# Patient Record
Sex: Female | Born: 1965 | Race: White | Hispanic: No | Marital: Married | Smoking: Never smoker
Health system: Southern US, Community
[De-identification: ages and names within clinical notes are randomized; demographics above are authoritative.]

## PROBLEM LIST (undated history)

## (undated) DIAGNOSIS — F419 Anxiety disorder, unspecified: Secondary | ICD-10-CM

## (undated) DIAGNOSIS — F329 Major depressive disorder, single episode, unspecified: Secondary | ICD-10-CM

## (undated) DIAGNOSIS — M549 Dorsalgia, unspecified: Secondary | ICD-10-CM

## (undated) DIAGNOSIS — Z98891 History of uterine scar from previous surgery: Secondary | ICD-10-CM

## (undated) DIAGNOSIS — M1990 Unspecified osteoarthritis, unspecified site: Secondary | ICD-10-CM

## (undated) DIAGNOSIS — F32A Depression, unspecified: Secondary | ICD-10-CM

---
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
## 2006-12-16 ENCOUNTER — Emergency Department (HOSPITAL_COMMUNITY): Admission: EM | Admit: 2006-12-16 | Discharge: 2006-12-16 | Payer: Self-pay | Admitting: Emergency Medicine

## 2006-12-18 ENCOUNTER — Emergency Department (HOSPITAL_COMMUNITY): Admission: EM | Admit: 2006-12-18 | Discharge: 2006-12-18 | Payer: Self-pay | Admitting: Family Medicine

## 2011-03-25 LAB — WOUND CULTURE

## 2011-06-27 ENCOUNTER — Emergency Department (HOSPITAL_COMMUNITY)
Admission: EM | Admit: 2011-06-27 | Discharge: 2011-06-27 | Disposition: A | Discharge status: Home / Self Care | Payer: No Typology Code available for payment source | Attending: Emergency Medicine | Admitting: Emergency Medicine

## 2011-06-27 ENCOUNTER — Encounter (HOSPITAL_COMMUNITY): Payer: Self-pay | Admitting: Emergency Medicine

## 2011-06-27 DIAGNOSIS — R51 Headache: Secondary | ICD-10-CM | POA: Insufficient documentation

## 2011-06-27 DIAGNOSIS — Y92410 Unspecified street and highway as the place of occurrence of the external cause: Secondary | ICD-10-CM | POA: Insufficient documentation

## 2011-06-27 DIAGNOSIS — IMO0002 Reserved for concepts with insufficient information to code with codable children: Secondary | ICD-10-CM

## 2011-06-27 DIAGNOSIS — S0100XA Unspecified open wound of scalp, initial encounter: Secondary | ICD-10-CM | POA: Insufficient documentation

## 2011-06-27 HISTORY — DX: History of uterine scar from previous surgery: Z98.891

## 2011-06-27 MED ORDER — HYDROCODONE-ACETAMINOPHEN 5-500 MG PO TABS: 1.0000 | ORAL_TABLET | Freq: Four times a day (QID) | ORAL | Status: AC | PRN

## 2011-06-27 MED ORDER — TETANUS-DIPHTH-ACELL PERTUSSIS 5-2.5-18.5 LF-MCG/0.5 IM SUSP
0.5000 mL | Freq: Once | INTRAMUSCULAR | Status: AC
  Administered 2011-06-27: 0.5 mL via INTRAMUSCULAR
  Filled 2011-06-27: qty 0.5

## 2011-06-27 MED ORDER — IBUPROFEN 800 MG PO TABS: 800.0000 mg | ORAL_TABLET | Freq: Three times a day (TID) | ORAL | Status: AC

## 2011-06-27 MED ORDER — CYCLOBENZAPRINE HCL 10 MG PO TABS: 10.0000 mg | ORAL_TABLET | Freq: Two times a day (BID) | ORAL | Status: AC | PRN

## 2011-06-27 MED ORDER — IBUPROFEN 800 MG PO TABS
800.0000 mg | ORAL_TABLET | Freq: Once | ORAL | Status: AC
  Administered 2011-06-27: 800 mg via ORAL
  Filled 2011-06-27: qty 1
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
## 2011-06-27 NOTE — ED Notes (Signed)
Pt tolerated ambulation well. Pt did not complain of any dizziness, light headiness, weakness or blurred vision
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
## 2017-12-14 DIAGNOSIS — M5030 Other cervical disc degeneration, unspecified cervical region: Secondary | ICD-10-CM | POA: Insufficient documentation

## 2017-12-14 DIAGNOSIS — M47812 Spondylosis without myelopathy or radiculopathy, cervical region: Secondary | ICD-10-CM | POA: Insufficient documentation

## 2017-12-14 DIAGNOSIS — M47816 Spondylosis without myelopathy or radiculopathy, lumbar region: Secondary | ICD-10-CM | POA: Insufficient documentation

## 2017-12-14 DIAGNOSIS — G894 Chronic pain syndrome: Secondary | ICD-10-CM | POA: Diagnosis present

## 2017-12-14 DIAGNOSIS — M4802 Spinal stenosis, cervical region: Secondary | ICD-10-CM | POA: Insufficient documentation

## 2018-05-04 DIAGNOSIS — F411 Generalized anxiety disorder: Secondary | ICD-10-CM | POA: Diagnosis present

## 2018-08-16 DIAGNOSIS — F329 Major depressive disorder, single episode, unspecified: Secondary | ICD-10-CM | POA: Diagnosis present

## 2019-02-08 DIAGNOSIS — S6721XA Crushing injury of right hand, initial encounter: Secondary | ICD-10-CM | POA: Insufficient documentation

## 2019-03-06 ENCOUNTER — Encounter (HOSPITAL_COMMUNITY): Payer: Self-pay

## 2019-03-06 ENCOUNTER — Emergency Department (HOSPITAL_COMMUNITY): Payer: BC Managed Care – PPO

## 2019-03-06 ENCOUNTER — Observation Stay (HOSPITAL_COMMUNITY)
Admission: EM | Admit: 2019-03-06 | Discharge: 2019-03-07 | Disposition: A | Discharge status: Home / Self Care | Payer: BC Managed Care – PPO | Attending: Family Medicine | Admitting: Family Medicine

## 2019-03-06 DIAGNOSIS — E876 Hypokalemia: Secondary | ICD-10-CM | POA: Insufficient documentation

## 2019-03-06 DIAGNOSIS — R404 Transient alteration of awareness: Secondary | ICD-10-CM | POA: Diagnosis present

## 2019-03-06 DIAGNOSIS — R4182 Altered mental status, unspecified: Secondary | ICD-10-CM | POA: Diagnosis present

## 2019-03-06 DIAGNOSIS — R400 Somnolence: Secondary | ICD-10-CM | POA: Diagnosis not present

## 2019-03-06 DIAGNOSIS — F411 Generalized anxiety disorder: Secondary | ICD-10-CM | POA: Diagnosis not present

## 2019-03-06 DIAGNOSIS — G894 Chronic pain syndrome: Secondary | ICD-10-CM | POA: Diagnosis not present

## 2019-03-06 DIAGNOSIS — F329 Major depressive disorder, single episode, unspecified: Secondary | ICD-10-CM | POA: Insufficient documentation

## 2019-03-06 DIAGNOSIS — Z20828 Contact with and (suspected) exposure to other viral communicable diseases: Secondary | ICD-10-CM | POA: Diagnosis not present

## 2019-03-06 HISTORY — DX: Dorsalgia, unspecified: M54.9

## 2019-03-06 HISTORY — DX: Depression, unspecified: F32.A

## 2019-03-06 HISTORY — DX: Unspecified osteoarthritis, unspecified site: M19.90

## 2019-03-06 LAB — RAPID URINE DRUG SCREEN, HOSP PERFORMED
Amphetamines: POSITIVE — AB
Barbiturates: NOT DETECTED
Benzodiazepines: NOT DETECTED
Cocaine: NOT DETECTED
Opiates: NOT DETECTED
Tetrahydrocannabinol: NOT DETECTED

## 2019-03-06 LAB — COMPREHENSIVE METABOLIC PANEL
ALT: 14 U/L (ref 0–44)
AST: 13 U/L — ABNORMAL LOW (ref 15–41)
Albumin: 3.5 g/dL (ref 3.5–5.0)
Alkaline Phosphatase: 50 U/L (ref 38–126)
Anion gap: 7 (ref 5–15)
BUN: 18 mg/dL (ref 6–20)
CO2: 27 mmol/L (ref 22–32)
Calcium: 8.9 mg/dL (ref 8.9–10.3)
Chloride: 107 mmol/L (ref 98–111)
Creatinine, Ser: 0.69 mg/dL (ref 0.44–1.00)
GFR calc Af Amer: >60 mL/min (ref >60)
GFR calc non Af Amer: >60 mL/min (ref >60)
Glucose, Bld: 126 mg/dL — ABNORMAL HIGH (ref 70–99)
Potassium: 3.2 mmol/L — ABNORMAL LOW (ref 3.5–5.1)
Sodium: 141 mmol/L (ref 135–145)
Total Bilirubin: 0.6 mg/dL (ref 0.3–1.2)
Total Protein: 6.5 g/dL (ref 6.5–8.1)

## 2019-03-06 LAB — URINALYSIS, ROUTINE W REFLEX MICROSCOPIC
Bilirubin Urine: NEGATIVE
Glucose, UA: NEGATIVE mg/dL
Hgb urine dipstick: NEGATIVE
Ketones, ur: NEGATIVE mg/dL
Leukocytes,Ua: NEGATIVE
Nitrite: NEGATIVE
Protein, ur: NEGATIVE mg/dL
Specific Gravity, Urine: 1.009 (ref 1.005–1.030)
pH: 6 (ref 5.0–8.0)

## 2019-03-06 LAB — CBC WITH DIFFERENTIAL/PLATELET
Abs Immature Granulocytes: 0.06 10*3/uL (ref 0.00–0.07)
Basophils Absolute: 0 10*3/uL (ref 0.0–0.1)
Basophils Relative: 0 %
Eosinophils Absolute: 0.1 10*3/uL (ref 0.0–0.5)
Eosinophils Relative: 1 %
HCT: 43.4 % (ref 36.0–46.0)
Hemoglobin: 13.6 g/dL (ref 12.0–15.0)
Immature Granulocytes: 1 %
Lymphocytes Relative: 13 %
Lymphs Abs: 1.5 10*3/uL (ref 0.7–4.0)
MCH: 29.1 pg (ref 26.0–34.0)
MCHC: 31.3 g/dL (ref 30.0–36.0)
MCV: 92.7 fL (ref 80.0–100.0)
Monocytes Absolute: 0.7 10*3/uL (ref 0.1–1.0)
Monocytes Relative: 6 %
Neutro Abs: 9 10*3/uL — ABNORMAL HIGH (ref 1.7–7.7)
Neutrophils Relative %: 79 %
Platelets: 312 10*3/uL (ref 150–400)
RBC: 4.68 MIL/uL (ref 3.87–5.11)
RDW: 13.3 % (ref 11.5–15.5)
WBC: 11.4 10*3/uL — ABNORMAL HIGH (ref 4.0–10.5)
nRBC: 0 % (ref 0.0–0.2)

## 2019-03-06 LAB — I-STAT CHEM 8, ED
BUN: 17 mg/dL (ref 6–20)
Calcium, Ion: 1.25 mmol/L (ref 1.15–1.40)
Chloride: 105 mmol/L (ref 98–111)
Creatinine, Ser: 0.7 mg/dL (ref 0.44–1.00)
Glucose, Bld: 119 mg/dL — ABNORMAL HIGH (ref 70–99)
HCT: 42 % (ref 36.0–46.0)
Hemoglobin: 14.3 g/dL (ref 12.0–15.0)
Potassium: 3.3 mmol/L — ABNORMAL LOW (ref 3.5–5.1)
Sodium: 142 mmol/L (ref 135–145)
TCO2: 27 mmol/L (ref 22–32)

## 2019-03-06 LAB — LACTIC ACID, PLASMA: Lactic Acid, Venous: 1.1 mmol/L (ref 0.5–1.9)

## 2019-03-06 LAB — AMMONIA: Ammonia: 11 umol/L (ref 9–35)

## 2019-03-06 LAB — TROPONIN I (HIGH SENSITIVITY)
Troponin I (High Sensitivity): 3 ng/L (ref <18)
Troponin I (High Sensitivity): 3 ng/L (ref <18)

## 2019-03-06 LAB — ETHANOL: Alcohol, Ethyl (B): <10 mg/dL (ref <10)

## 2019-03-06 LAB — SARS CORONAVIRUS 2 BY RT PCR (HOSPITAL ORDER, PERFORMED IN ~~LOC~~ HOSPITAL LAB): SARS Coronavirus 2: NEGATIVE

## 2019-03-06 MED ORDER — POTASSIUM CHLORIDE IN NACL 20-0.9 MEQ/L-% IV SOLN
INTRAVENOUS | Status: AC
  Administered 2019-03-06: 20:00:00 via INTRAVENOUS
  Filled 2019-03-06: qty 1000

## 2019-03-06 MED ORDER — ACETAMINOPHEN 650 MG RE SUPP: 650.0000 mg | Freq: Four times a day (QID) | RECTAL | Status: DC | PRN

## 2019-03-06 MED ORDER — NALOXONE HCL 2 MG/2ML IJ SOSY
1.0000 mg | PREFILLED_SYRINGE | Freq: Once | INTRAMUSCULAR | Status: AC
  Administered 2019-03-06: 1 mg via INTRAVENOUS
  Filled 2019-03-06: qty 2

## 2019-03-06 MED ORDER — ENOXAPARIN SODIUM 40 MG/0.4ML ~~LOC~~ SOLN
40.0000 mg | SUBCUTANEOUS | Status: DC
  Administered 2019-03-06: 20:00:00 40 mg via SUBCUTANEOUS
  Filled 2019-03-06: qty 0.4

## 2019-03-06 MED ORDER — SODIUM CHLORIDE 0.9 % IV BOLUS
1000.0000 mL | Freq: Once | INTRAVENOUS | Status: AC
  Administered 2019-03-06: 16:00:00 1000 mL via INTRAVENOUS

## 2019-03-06 MED ORDER — ACETAMINOPHEN 325 MG PO TABS
650.0000 mg | ORAL_TABLET | Freq: Four times a day (QID) | ORAL | Status: DC | PRN
  Administered 2019-03-07: 650 mg via ORAL
  Filled 2019-03-06: qty 2

## 2019-03-06 NOTE — ED Notes (Addendum)
Pt presents with meds from 3 different providers filled by [facility] in [facility]. White  Vvanse  4mg  Qam  Clonazapam 0.5mg  BID PRN anxiety  Meloxicam 7.5mg  QD  D O'Toole  Tizanidine 4 mg Q 8 H PRN  S.Truhe Baclofen 10 mg TID x 30 days

## 2019-03-06 NOTE — ED Notes (Signed)
spouse at bedside   Requested to speak with physician re labs and test   Dr Earnest Conroy apprised

## 2019-03-06 NOTE — ED Notes (Signed)
Spouse says pt was hard to arouse yesterday afternoon.  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

## 2019-03-06 NOTE — ED Notes (Signed)
Report to Natasha, RN

## 2019-03-06 NOTE — ED Notes (Signed)
Dr Roderic Palau spoke with spouse   spouse states she has spoken with a friend  Pt has not responded to this RN save open her eyes during covid

## 2019-03-06 NOTE — H&P (Signed)
TRH H&P    Patient Demographics:    Jenna Morris, is a 53 y.o. female  MRN: [id number]  DOB - 02/25/1966  Admit Date - 03/06/2019  Referring MD/NP/PA: Denton Meek  Outpatient Primary MD for the patient is Patient, No Pcp Per [facility] in [place]  Patient coming from:  home  Chief complaint- altered mental status.    HPI:    Jenna Morris  is a 53 y.o. female, w anxiety/ depression, ADD, chronic neck pain, apparently presents with c/o altered mental status. Pt reportedly took some muscle relaxer. Started on Friday.  Hard to awaken since Saturday nite. Apparently fell asleep at church and then went to her fathers house and was somnolent and brought to ED.   In ED,  T 97 , P 49 R 16, Bp 113/74 pox 98% on RA  CT brain  IMPRESSION: Negative non contrasted CT appearance of the brain  CXR IMPRESSION: Mild cardiomegaly with mild central pulmonary vascular congestion  Na 141, K 3.2, Bun 18, Creatinine 0.69 Ast 13, Alt  14 Wbc 11.4, hgb 13.6, Plt 312 Ammonia 11  Urinalysis negative UDS + amphetamine  Pt will be admitted for altered mental status secondary to ? Muscle relaxer use.     Review of systems:    In addition to the HPI above, pt unable to give ROS due to AMS  No Fever-chills, No Headache, No changes with Vision or hearing, No problems swallowing food or Liquids, No Chest pain, Cough or Shortness of Breath, No Abdominal pain, No Nausea or Vomiting, bowel movements are regular, No Blood in stool or Urine, No dysuria, No new skin rashes or bruises, No new joints pains-aches,  No new weakness, tingling, numbness in any extremity, No recent weight gain or loss, No polyuria, polydypsia or polyphagia, No significant Mental Stressors.  All other systems reviewed and are negative.    Past History of the following :    Past Medical History:  Diagnosis Date  . Back pain    . Depression   . DJD (degenerative joint disease)       History reviewed. No pertinent surgical history. Unable to obtain due to AMS   Social History:      Social History   Tobacco Use  . Smoking status: Never Smoker  . Smokeless tobacco: Never Used  Substance Use Topics  . Alcohol use: Never    Frequency: Never       Family History :    No family history on file. Unable to obtain due to AMS   Home Medications:   Prior to Admission medications   Medication Sig Start Date End Date Taking? Authorizing Provider  baclofen (LIORESAL) 10 MG tablet Take 10 mg by mouth 3 (three) times daily.  03/04/19 04/03/19 Yes [provider]  clonazePAM (KLONOPIN) 0.5 MG tablet Take 0.5 mg by mouth 2 (two) times daily as needed for anxiety.  11/25/18  Yes [provider]  ferrous sulfate 325 (65 FE) MG tablet Take 325 mg by mouth daily with breakfast.  01/17/19  Yes [provider]  gabapentin (NEURONTIN) 600 MG tablet Take 600 mg by mouth 3 (three) times daily.  03/04/19 06/02/19 Yes [provider]  lisdexamfetamine (VYVANSE) 40 MG capsule Take 40 mg by mouth every morning.  02/03/19  Yes [provider]  meloxicam (MOBIC) 7.5 MG tablet Take 7.5 mg by mouth daily.  12/21/18  Yes [provider]  predniSONE (DELTASONE) 10 MG tablet Take 10 mg by mouth See admin instructions. Take as directed per package instructions: 6 tablets on day 1, then 5 tablets on day 2, then 4 tablets on day 3, then 3 tablets on day 4, then 2 tablets on day 5, then take 1 tablet on day 6, THEN STOP. 02/28/19 03/06/19 Yes [provider]  sertraline (ZOLOFT) 50 MG tablet Take 50 mg by mouth daily.  01/17/19  Yes [provider]  tiZANidine (ZANAFLEX) 4 MG tablet Take 4 mg by mouth every 8 (eight) hours as needed for muscle spasms.  10/25/18  Yes [provider]  ibuprofen (ADVIL) 200 MG tablet Take by mouth.    [provider]  naproxen  sodium (ALEVE) 220 MG tablet Take 220 mg by mouth daily as needed (for pain).     [provider]     Allergies:     Allergies  Allergen Reactions  . Penicillins Hives  . Aspirin Rash     Physical Exam:   Vitals  Blood pressure (!) 143/86, pulse (!) 50, temperature (!) 97 F (36.1 C), temperature source Rectal, resp. rate 16, SpO2 100 %.  1.  General: somnolent  2. Psychiatric: Unable to assess due to somnolence  3. Neurologic: cn2-12 intact, reflexes 2+ symmetric, diffuse with no clonus, motor 5/5 in all 4 ext (moves all 4 ext to painful stimuli)  4. HEENMT:  Anicteric, pupils 52m symmetric, direct, consensual intact Neck: no jvd  5. Respiratory : CTAB  6. Cardiovascular : rrr s1, s2,   7. Gastrointestinal:  Abd: soft, nt, nd, +bs  8. Skin:  Ext: no c/c/e, no rash + tattoo  9.Musculoskeletal:  Good ROM    Data Review:    CBC Recent Labs  Lab 03/06/19 1600  WBC 11.4*  HGB 13.6  14.3  HCT 43.4  42.0  PLT 312  MCV 92.7  MCH 29.1  MCHC 31.3  RDW 13.3  LYMPHSABS 1.5  MONOABS 0.7  EOSABS 0.1  BASOSABS 0.0   ------------------------------------------------------------------------------------------------------------------  Results for orders placed or performed during the hospital encounter of 03/06/19 (from the past 48 hour(s))  I-stat chem 8, [facility]     Status: Abnormal   Collection Time: 03/06/19  4:00 PM  Result Value Ref Range   Sodium 142 135 - 145 mmol/L   Potassium 3.3 (L) 3.5 - 5.1 mmol/L   Chloride 105 98 - 111 mmol/L   BUN 17 6 - 20 mg/dL   Creatinine, Ser 0.70 0.44 - 1.00 mg/dL   Glucose, Bld 119 (H) 70 - 99 mg/dL   Calcium, Ion 1.25 1.15 - 1.40 mmol/L   TCO2 27 22 - 32 mmol/L   Hemoglobin 14.3 12.0 - 15.0 g/dL   HCT 42.0 36.0 - 46.0 %  CBC with Differential/Platelet     Status: Abnormal   Collection Time: 03/06/19  4:00 PM  Result Value Ref Range   WBC 11.4 (H) 4.0 - 10.5 K/uL   RBC 4.68 3.87 -  5.11 MIL/uL   Hemoglobin 13.6 12.0 - 15.0 g/dL  HCT 43.4 36.0 - 46.0 %   MCV 92.7 80.0 - 100.0 fL   MCH 29.1 26.0 - 34.0 pg   MCHC 31.3 30.0 - 36.0 g/dL   RDW 13.3 11.5 - 15.5 %   Platelets 312 150 - 400 K/uL   nRBC 0.0 0.0 - 0.2 %   Neutrophils Relative % 79 %   Neutro Abs 9.0 (H) 1.7 - 7.7 K/uL   Lymphocytes Relative 13 %   Lymphs Abs 1.5 0.7 - 4.0 K/uL   Monocytes Relative 6 %   Monocytes Absolute 0.7 0.1 - 1.0 K/uL   Eosinophils Relative 1 %   Eosinophils Absolute 0.1 0.0 - 0.5 K/uL   Basophils Relative 0 %   Basophils Absolute 0.0 0.0 - 0.1 K/uL   Immature Granulocytes 1 %   Abs Immature Granulocytes 0.06 0.00 - 0.07 K/uL    Comment: Performed at [facility], [address], [place], [place] [address]  Comprehensive metabolic panel     Status: Abnormal   Collection Time: 03/06/19  4:00 PM  Result Value Ref Range   Sodium 141 135 - 145 mmol/L   Potassium 3.2 (L) 3.5 - 5.1 mmol/L   Chloride 107 98 - 111 mmol/L   CO2 27 22 - 32 mmol/L   Glucose, Bld 126 (H) 70 - 99 mg/dL   BUN 18 6 - 20 mg/dL   Creatinine, Ser 0.69 0.44 - 1.00 mg/dL   Calcium 8.9 8.9 - 10.3 mg/dL   Total Protein 6.5 6.5 - 8.1 g/dL   Albumin 3.5 3.5 - 5.0 g/dL   AST 13 (L) 15 - 41 U/L   ALT 14 0 - 44 U/L   Alkaline Phosphatase 50 38 - 126 U/L   Total Bilirubin 0.6 0.3 - 1.2 mg/dL   GFR calc non Af Amer >60 >60 mL/min   GFR calc Af Amer >60 >60 mL/min   Anion gap 7 5 - 15    Comment: Performed at [facility], [address] [place], [place] [address]  Lactic acid, plasma     Status: None   Collection Time: 03/06/19  4:00 PM  Result Value Ref Range   Lactic Acid, Venous 1.1 0.5 - 1.9 mmol/L    Comment: Performed at [facility], [address], [place], [place] [address]  Ammonia     Status: None   Collection Time: 03/06/19  4:00 PM  Result Value Ref Range   Ammonia 11 9 - 35 umol/L    Comment: Performed at [facility], [address], [place], [place] [address]  Troponin I (High Sensitivity)      Status: None   Collection Time: 03/06/19  4:00 PM  Result Value Ref Range   Troponin I (High Sensitivity) 3 <18 ng/L    Comment: (NOTE) Elevated high sensitivity troponin I (hsTnI) values and significant  changes across serial measurements may suggest ACS but many other  chronic and acute conditions are known to elevate hsTnI results.  Refer to the "Links" section for chest pain algorithms and additional  guidance. Performed at [facility], [address], [place], [place] [address]   Urinalysis, Routine w reflex microscopic     Status: None   Collection Time: 03/06/19  4:00 PM  Result Value Ref Range   Color, Urine YELLOW YELLOW   APPearance CLEAR CLEAR   Specific Gravity, Urine 1.009 1.005 - 1.030   pH 6.0 5.0 - 8.0   Glucose, UA NEGATIVE NEGATIVE mg/dL   Hgb urine dipstick NEGATIVE  NEGATIVE   Bilirubin Urine NEGATIVE NEGATIVE   Ketones, ur NEGATIVE NEGATIVE mg/dL   Protein, ur NEGATIVE NEGATIVE mg/dL   Nitrite NEGATIVE NEGATIVE   Leukocytes,Ua NEGATIVE NEGATIVE    Comment: Performed at [facility], [address], [place], [place] [address]  Rapid urine drug screen (hospital performed)     Status: Abnormal   Collection Time: 03/06/19  4:00 PM  Result Value Ref Range   Opiates NONE DETECTED NONE DETECTED   Cocaine NONE DETECTED NONE DETECTED   Benzodiazepines NONE DETECTED NONE DETECTED   Amphetamines POSITIVE (A) NONE DETECTED   Tetrahydrocannabinol NONE DETECTED NONE DETECTED   Barbiturates NONE DETECTED NONE DETECTED    Comment: (NOTE) DRUG SCREEN FOR MEDICAL PURPOSES ONLY.  IF CONFIRMATION IS NEEDED FOR ANY PURPOSE, NOTIFY LAB WITHIN 5 DAYS. LOWEST DETECTABLE LIMITS FOR URINE DRUG SCREEN Drug Class                     Cutoff (ng/mL) Amphetamine and metabolites    1000 Barbiturate and metabolites    200 Benzodiazepine                 588 Tricyclics and metabolites     300 Opiates and metabolites        300 Cocaine and metabolites        300 THC                             50 Performed at [facility], [address], [place], [place] [address]   Ethanol     Status: None   Collection Time: 03/06/19  4:00 PM  Result Value Ref Range   Alcohol, Ethyl (B) <10 <10 mg/dL    Comment: (NOTE) Lowest detectable limit for serum alcohol is 10 mg/dL. For medical purposes only. Performed at [facility], [address], [place],  [address]   SARS Coronavirus 2 [facility] order, Performed in [facility] hospital lab) Nasopharyngeal Nasopharyngeal Swab     Status: None   Collection Time: 03/06/19  4:53 PM   Specimen: Nasopharyngeal Swab  Result Value Ref Range   SARS Coronavirus 2 NEGATIVE NEGATIVE    Comment: (NOTE) If result is NEGATIVE SARS-CoV-2 target nucleic acids are NOT DETECTED. The SARS-CoV-2 RNA is generally detectable in upper and lower  respiratory specimens during the acute phase of infection. The lowest  concentration of SARS-CoV-2 viral copies this assay can detect is 250  copies / mL. A negative result does not preclude SARS-CoV-2 infection  and should not be used as the sole basis for treatment or other  patient management decisions.  A negative result may occur with  improper specimen collection / handling, submission of specimen other  than nasopharyngeal swab, presence of viral mutation(s) within the  areas targeted by this assay, and inadequate number of viral copies  (<250 copies / mL). A negative result must be combined with clinical  observations, patient history, and epidemiological information. If result is POSITIVE SARS-CoV-2 target nucleic acids are DETECTED. The SARS-CoV-2 RNA is generally detectable in upper and lower  respiratory specimens dur ing the acute phase of infection.  Positive  results are indicative of active infection with SARS-CoV-2.  Clinical  correlation with patient history and other diagnostic information is  necessary to determine patient infection status.  Positive results do  not rule out bacterial  infection or co-infection with other viruses. If result is PRESUMPTIVE POSTIVE SARS-CoV-2 nucleic acids  MAY BE PRESENT.   A presumptive positive result was obtained on the submitted specimen  and confirmed on repeat testing.  While 2019 novel coronavirus  (SARS-CoV-2) nucleic acids may be present in the submitted sample  additional confirmatory testing may be necessary for epidemiological  and / or clinical management purposes  to differentiate between  SARS-CoV-2 and other Sarbecovirus currently known to infect humans.  If clinically indicated additional testing with an alternate test  methodology [phone]) is advised. The SARS-CoV-2 RNA is generally  detectable in upper and lower respiratory sp ecimens during the acute  phase of infection. The expected result is Negative. Fact Sheet for Patients:  StrictlyIdeas.no Fact Sheet for Healthcare Providers: BankingDealers.co.za This test is not yet approved or cleared by the [place] FDA and has been authorized for detection and/or diagnosis of SARS-CoV-2 by FDA under an Emergency Use Authorization (EUA).  This EUA will remain in effect (meaning this test can be used) for the duration of the COVID-19 declaration under Section 564(b)(1) of the Act, 21 U.S.C. section 360bbb-3(b)(1), unless the authorization is terminated or revoked sooner. Performed at [facility], [address] [place], [place] [address]   Troponin I (High Sensitivity)     Status: None   Collection Time: 03/06/19  6:05 PM  Result Value Ref Range   Troponin I (High Sensitivity) 3 <18 ng/L    Comment: (NOTE) Elevated high sensitivity troponin I (hsTnI) values and significant  changes across serial measurements may suggest ACS but many other  chronic and acute conditions are known to elevate hsTnI results.  Refer to the "Links" section for chest pain algorithms and additional  guidance. Performed at [facility],  [address], [place],  [address]     Chemistries  Recent Labs  Lab 03/06/19 1600  NA 141  142  K 3.2*  3.3*  CL 107  105  CO2 27  GLUCOSE 126*  119*  BUN 18  17  CREATININE 0.69  0.70  CALCIUM 8.9  AST 13*  ALT 14  ALKPHOS 50  BILITOT 0.6   ------------------------------------------------------------------------------------------------------------------  ------------------------------------------------------------------------------------------------------------------ GFR: CrCl cannot be calculated (Unknown ideal weight.). Liver Function Tests: Recent Labs  Lab 03/06/19 1600  AST 13*  ALT 14  ALKPHOS 50  BILITOT 0.6  PROT 6.5  ALBUMIN 3.5   No results for input(s): LIPASE, AMYLASE in the last 168 hours. Recent Labs  Lab 03/06/19 1600  AMMONIA 11   Coagulation Profile: No results for input(s): INR, PROTIME in the last 168 hours. Cardiac Enzymes: No results for input(s): CKTOTAL, CKMB, CKMBINDEX, TROPONINI in the last 168 hours. BNP (last 3 results) No results for input(s): PROBNP in the last 8760 hours. HbA1C: No results for input(s): HGBA1C in the last 72 hours. CBG: No results for input(s): GLUCAP in the last 168 hours. Lipid Profile: No results for input(s): CHOL, HDL, LDLCALC, TRIG, CHOLHDL, LDLDIRECT in the last 72 hours. Thyroid Function Tests: No results for input(s): TSH, T4TOTAL, FREET4, T3FREE, THYROIDAB in the last 72 hours. Anemia Panel: No results for input(s): VITAMINB12, FOLATE, FERRITIN, TIBC, IRON, RETICCTPCT in the last 72 hours.  --------------------------------------------------------------------------------------------------------------- Urine analysis:    Component Value Date/Time   COLORURINE YELLOW 03/06/2019 1600   APPEARANCEUR CLEAR 03/06/2019 1600   LABSPEC 1.009 03/06/2019 1600   PHURINE 6.0 03/06/2019 1600   GLUCOSEU NEGATIVE 03/06/2019 1600   HGBUR NEGATIVE 03/06/2019 1600   BILIRUBINUR NEGATIVE 03/06/2019 1600    KETONESUR NEGATIVE 03/06/2019 1600   PROTEINUR NEGATIVE 03/06/2019 1600  NITRITE NEGATIVE 03/06/2019 1600   LEUKOCYTESUR NEGATIVE 03/06/2019 1600      Imaging Results:    Ct Head Wo Contrast  Result Date: 03/06/2019 CLINICAL DATA:  Altered LOC EXAM: CT HEAD WITHOUT CONTRAST TECHNIQUE: Contiguous axial images were obtained from the base of the skull through the vertex without intravenous contrast. COMPARISON:  None. FINDINGS: Brain: No evidence of acute infarction, hemorrhage, hydrocephalus, extra-axial collection or mass lesion/mass effect. Vascular: No hyperdense vessel or unexpected calcification. Skull: Normal. Negative for fracture or focal lesion. Sinuses/Orbits: No acute finding. Other: None IMPRESSION: Negative non contrasted CT appearance of the brain Electronically Signed   By: Donavan Foil M.D.   On: 03/06/2019 17:35   Dg Chest Portable 1 View  Result Date: 03/06/2019 CLINICAL DATA:  Weakness. EXAM: PORTABLE CHEST 1 VIEW COMPARISON:  None. FINDINGS: Mild cardiomegaly is noted with mild central pulmonary vascular congestion. No pneumothorax or pleural effusion is noted. No consolidative process is noted. Bony thorax is unremarkable. IMPRESSION: Mild cardiomegaly with mild central pulmonary vascular congestion Electronically Signed   By: Marijo Conception M.D.   On: 03/06/2019 16:28       Assessment & Plan:    Principal Problem:   Altered mental status Active Problems:   GAD (generalized anxiety disorder)   MDD (major depressive disorder)   Chronic pain disorder   Hypokalemia  AMS secondary to muscle relaxer use Check b12, esr, rpr, tsh Monitor If not improving then consider MRI brain, EEG, and neurology consult  Hypokalemia Replete Check cmp in am  GAD/ Depression Hold Clonazepam  Chronic pain Hold Gabapentin  ADD Cont Vyvanse   DVT Prophylaxis-   Lovenox - SCDs   AM Labs Ordered, also please review Full Orders  Family Communication: Admission, patients  condition and plan of care including tests being ordered have been discussed with the patient  who indicate understanding and agree with the plan and Code Status.  Code Status:  FULL CODE ,  Pt is unable to provide otherwise.  Notified husband that pt admitted   Admission status: Observation: Based on patients clinical presentation and evaluation of above clinical data, I have made determination that patient meets observation  criteria at this time.    Time spent in minutes : 55 minutes   Jani Gravel M.D on 03/06/2019 at 7:45 PM

## 2019-03-06 NOTE — Progress Notes (Signed)
Mews Score of 2 due to LOC. MD already aware. Will continue to monitor patient.

## 2019-03-06 NOTE — ED Provider Notes (Signed)
[facility] Provider [facility]   CSN: [id number] Arrival date & time: 03/06/19  1514     History   Chief Complaint Chief Complaint  Patient presents with  . Altered Mental Status    HPI Jenna Morris is a 53 y.o. female.     Patient states that she took 20 mg of baclofen and 8 mg of Zanaflex because her back was bothering her.  She just started yesterday on the Zanaflex.  Patient husband states that she is been sleepy ever since  The history is provided by the patient and a relative. No language interpreter was used.  Altered Mental Status Presenting symptoms: behavior changes   Severity:  Severe Most recent episode:  Today Episode history:  Continuous Timing:  Constant Progression:  Waxing and waning Chronicity:  New Context: not alcohol use   Associated symptoms: no abdominal pain, no hallucinations, no headaches, no rash and no seizures     Past Medical History:  Diagnosis Date  . Back pain   . Depression   . DJD (degenerative joint disease)     Patient Active Problem List   Diagnosis Date Noted  . Crushing injury of right hand 02/08/2019  . MDD (major depressive disorder) 08/16/2018  . GAD (generalized anxiety disorder) 05/04/2018  . DDD (degenerative disc disease), cervical 12/14/2017  . Neural foraminal stenosis of cervical spine 12/14/2017  . Spondylosis of lumbar spine 12/14/2017  . Chronic pain disorder 12/14/2017  . Cervical spondylosis without myelopathy 12/14/2017  . Arthropathy of cervical facet joint 12/14/2017    History reviewed. No pertinent surgical history.   OB History   No obstetric history on file.      Home Medications    Prior to Admission medications   Medication Sig Start Date End Date Taking? Authorizing Provider  baclofen (LIORESAL) 10 MG tablet Take 10 mg by mouth 3 (three) times daily.  03/04/19 04/03/19 Yes [provider]  clonazePAM (KLONOPIN) 0.5 MG tablet Take 0.5 mg by mouth 2 (two) times  daily as needed for anxiety.  11/25/18  Yes [provider]  ferrous sulfate 325 (65 FE) MG tablet Take 325 mg by mouth daily with breakfast.  01/17/19  Yes [provider]  gabapentin (NEURONTIN) 600 MG tablet Take 600 mg by mouth 3 (three) times daily.  03/04/19 06/02/19 Yes [provider]  lisdexamfetamine (VYVANSE) 40 MG capsule Take 40 mg by mouth every morning.  02/03/19  Yes [provider]  meloxicam (MOBIC) 7.5 MG tablet Take 7.5 mg by mouth daily.  12/21/18  Yes [provider]  predniSONE (DELTASONE) 10 MG tablet Take 10 mg by mouth See admin instructions. Take as directed per package instructions: 6 tablets on day 1, then 5 tablets on day 2, then 4 tablets on day 3, then 3 tablets on day 4, then 2 tablets on day 5, then take 1 tablet on day 6, THEN STOP. 02/28/19 03/06/19 Yes [provider]  sertraline (ZOLOFT) 50 MG tablet Take 50 mg by mouth daily.  01/17/19  Yes [provider]  tiZANidine (ZANAFLEX) 4 MG tablet Take 4 mg by mouth every 8 (eight) hours as needed for muscle spasms.  10/25/18  Yes [provider]  ibuprofen (ADVIL) 200 MG tablet Take by mouth.    [provider]  naproxen sodium (ALEVE) 220 MG tablet Take 220 mg by mouth daily as needed (for pain).     [provider]    Family History No family history  on file.  Social History Social History   Tobacco Use  . Smoking status: Never Smoker  . Smokeless tobacco: Never Used  Substance Use Topics  . Alcohol use: Never    Frequency: Never  . Drug use: Never     Allergies   Penicillins and Aspirin   Review of Systems Review of Systems  Unable to perform ROS: Mental status change  Constitutional: Negative for appetite change and fatigue.  HENT: Negative for congestion, ear discharge and sinus pressure.   Eyes: Negative for discharge.  Respiratory: Negative for cough.   Cardiovascular: Negative for chest pain.   Gastrointestinal: Negative for abdominal pain and diarrhea.  Genitourinary: Negative for frequency and hematuria.  Musculoskeletal: Negative for back pain.  Skin: Negative for rash.  Neurological: Negative for seizures and headaches.  Psychiatric/Behavioral: Negative for hallucinations.     Physical Exam Updated Vital Signs BP (!) 143/86   Pulse (!) 50   Temp (!) 97 F (36.1 C) (Rectal)   Resp 16   SpO2 100%   Physical Exam Vitals signs and nursing note reviewed.  Constitutional:      Appearance: She is well-developed.  HENT:     Head: Normocephalic.     Nose: Nose normal.  Eyes:     General: No scleral icterus.    Conjunctiva/sclera: Conjunctivae normal.  Neck:     Musculoskeletal: Neck supple.     Thyroid: No thyromegaly.  Cardiovascular:     Rate and Rhythm: Normal rate and regular rhythm.     Heart sounds: No murmur. No friction rub. No gallop.   Pulmonary:     Breath sounds: No stridor. No wheezing or rales.  Chest:     Chest wall: No tenderness.  Abdominal:     General: There is no distension.     Tenderness: There is no abdominal tenderness. There is no rebound.  Musculoskeletal: Normal range of motion.  Lymphadenopathy:     Cervical: No cervical adenopathy.  Skin:    Findings: No erythema or rash.  Neurological:     Motor: No abnormal muscle tone.     Coordination: Coordination normal.     Comments: Patient responded to painful stimuli only, minor gag reflex      ED Treatments / Results  Labs (all labs ordered are listed, but only abnormal results are displayed) Labs Reviewed  CBC WITH DIFFERENTIAL/PLATELET - Abnormal; Notable for the following components:      Result Value   WBC 11.4 (*)    Neutro Abs 9.0 (*)    All other components within normal limits  COMPREHENSIVE METABOLIC PANEL - Abnormal; Notable for the following components:   Potassium 3.2 (*)    Glucose, Bld 126 (*)    AST 13 (*)    All other components within normal limits   RAPID URINE DRUG SCREEN, HOSP PERFORMED - Abnormal; Notable for the following components:   Amphetamines POSITIVE (*)    All other components within normal limits  I-STAT CHEM 8, ED - Abnormal; Notable for the following components:   Potassium 3.3 (*)    Glucose, Bld 119 (*)    All other components within normal limits  SARS CORONAVIRUS 2 (HOSPITAL ORDER, [facility])  LACTIC ACID, PLASMA  AMMONIA  URINALYSIS, ROUTINE W REFLEX MICROSCOPIC  ETHANOL  TROPONIN I (HIGH SENSITIVITY)  TROPONIN I (HIGH SENSITIVITY)    EKG None  Radiology Ct Head Wo Contrast  Result Date: 03/06/2019 CLINICAL DATA:  Altered LOC EXAM:  CT HEAD WITHOUT CONTRAST TECHNIQUE: Contiguous axial images were obtained from the base of the skull through the vertex without intravenous contrast. COMPARISON:  None. FINDINGS: Brain: No evidence of acute infarction, hemorrhage, hydrocephalus, extra-axial collection or mass lesion/mass effect. Vascular: No hyperdense vessel or unexpected calcification. Skull: Normal. Negative for fracture or focal lesion. Sinuses/Orbits: No acute finding. Other: None IMPRESSION: Negative non contrasted CT appearance of the brain Electronically Signed   By: Jasmine Pang M.D.   On: 03/06/2019 17:35   Dg Chest Portable 1 View  Result Date: 03/06/2019 CLINICAL DATA:  Weakness. EXAM: PORTABLE CHEST 1 VIEW COMPARISON:  None. FINDINGS: Mild cardiomegaly is noted with mild central pulmonary vascular congestion. No pneumothorax or pleural effusion is noted. No consolidative process is noted. Bony thorax is unremarkable. IMPRESSION: Mild cardiomegaly with mild central pulmonary vascular congestion Electronically Signed   By: Lupita Raider M.D.   On: 03/06/2019 16:28    Procedures Procedures (including critical care time)  Medications Ordered in ED Medications  sodium chloride 0.9 % bolus 1,000 mL (0 mLs Intravenous Stopped 03/06/19 1734)  naloxone (NARCAN) injection 1 mg (1  mg Intravenous Given 03/06/19 1548)     Initial Impression / Assessment and Plan / ED Course  I have reviewed the triage vital signs and the nursing notes.  Pertinent labs & imaging results that were available during my care of the patient were reviewed by me and considered in my medical decision making (see chart for details).        CRITICAL CARE Performed by: Bethann Berkshire Total critical care time:40 minutes Critical care time was exclusive of separately billable procedures and treating other patients. Critical care was necessary to treat or prevent imminent or life-threatening deterioration. Critical care was time spent personally by me on the following activities: development of treatment plan with patient and/or surrogate as well as nursing, discussions with consultants, evaluation of patient's response to treatment, examination of patient, obtaining history from patient or surrogate, ordering and performing treatments and interventions, ordering and review of laboratory studies, ordering and review of radiographic studies, pulse oximetry and re-evaluation of patient's condition.  Labs and CT scan unremarkable.  Patient will be admitted for altered mental status from taking too much most relaxers Final Clinical Impressions(s) / ED Diagnoses   Final diagnoses:  Somnolence    ED Discharge Orders    None       Bethann Berkshire, MD 03/06/19 1944

## 2019-03-06 NOTE — ED Notes (Signed)
Awaiting hospitalist 

## 2019-03-06 NOTE — ED Notes (Signed)
Jenna Morris husband  [phone]

## 2019-03-06 NOTE — ED Notes (Signed)
Call for report   Ronny Bacon, RN in with a pt and will call back

## 2019-03-06 NOTE — ED Notes (Signed)
Pt now opens her eyes while covid obtained  She does not verbally respond  Spouse checks on frequently

## 2019-03-06 NOTE — ED Notes (Signed)
Pt has a nasal airway in place  Foley  Does not respond to loud voice   Awaiting lab results

## 2019-03-06 NOTE — ED Triage Notes (Signed)
EMS reports they were called out because pt's father had difficulty waking pt up this afternoon.  When ems arrived they were able to arouse pt and pt told them she got a new medication, baclofen, on Friday and took 2 10mg  and 2 tizanidine 4mg  tabs around 1030 this morning for her back pain.  Pt responsive to loud voice but goes right back to sleep.   CBG 140 per ems.  Asked pt if she was trying to hurt herself today taking those medications and she said no, her back and neck hurt.

## 2019-03-07 DIAGNOSIS — F411 Generalized anxiety disorder: Secondary | ICD-10-CM

## 2019-03-07 DIAGNOSIS — G894 Chronic pain syndrome: Secondary | ICD-10-CM

## 2019-03-07 DIAGNOSIS — R4182 Altered mental status, unspecified: Secondary | ICD-10-CM | POA: Diagnosis not present

## 2019-03-07 LAB — COMPREHENSIVE METABOLIC PANEL
ALT: 14 U/L (ref 0–44)
AST: 10 U/L — ABNORMAL LOW (ref 15–41)
Albumin: 3.2 g/dL — ABNORMAL LOW (ref 3.5–5.0)
Alkaline Phosphatase: 51 U/L (ref 38–126)
Anion gap: 7 (ref 5–15)
BUN: 11 mg/dL (ref 6–20)
CO2: 27 mmol/L (ref 22–32)
Calcium: 8.5 mg/dL — ABNORMAL LOW (ref 8.9–10.3)
Chloride: 111 mmol/L (ref 98–111)
Creatinine, Ser: 0.54 mg/dL (ref 0.44–1.00)
GFR calc Af Amer: >60 mL/min (ref >60)
GFR calc non Af Amer: >60 mL/min (ref >60)
Glucose, Bld: 87 mg/dL (ref 70–99)
Potassium: 3.6 mmol/L (ref 3.5–5.1)
Sodium: 145 mmol/L (ref 135–145)
Total Bilirubin: 0.4 mg/dL (ref 0.3–1.2)
Total Protein: 6 g/dL — ABNORMAL LOW (ref 6.5–8.1)

## 2019-03-07 LAB — CBC
HCT: 43.8 % (ref 36.0–46.0)
Hemoglobin: 14 g/dL (ref 12.0–15.0)
MCH: 29.5 pg (ref 26.0–34.0)
MCHC: 32 g/dL (ref 30.0–36.0)
MCV: 92.2 fL (ref 80.0–100.0)
Platelets: 329 10*3/uL (ref 150–400)
RBC: 4.75 MIL/uL (ref 3.87–5.11)
RDW: 13.3 % (ref 11.5–15.5)
WBC: 9.8 10*3/uL (ref 4.0–10.5)
nRBC: 0 % (ref 0.0–0.2)

## 2019-03-07 LAB — VITAMIN B12: Vitamin B-12: 206 pg/mL (ref 180–914)

## 2019-03-07 LAB — SEDIMENTATION RATE: Sed Rate: 5 mm/hr (ref 0–22)

## 2019-03-07 LAB — TSH: TSH: 1.181 u[IU]/mL (ref 0.350–4.500)

## 2019-03-07 MED ORDER — BACLOFEN 10 MG PO TABS: 5.0000 mg | ORAL_TABLET | Freq: Two times a day (BID) | ORAL | 0 refills | Status: AC

## 2019-03-07 MED ORDER — INFLUENZA VAC SPLIT QUAD 0.5 ML IM SUSY: 0.5000 mL | PREFILLED_SYRINGE | INTRAMUSCULAR | Status: DC

## 2019-03-07 MED ORDER — ACETAMINOPHEN 325 MG PO TABS: 650.0000 mg | ORAL_TABLET | Freq: Four times a day (QID) | ORAL | 2 refills | Status: AC | PRN

## 2019-03-07 MED ORDER — ACETAMINOPHEN 325 MG PO TABS: 650.0000 mg | ORAL_TABLET | ORAL | Status: DC | PRN

## 2019-03-07 MED ORDER — ONDANSETRON HCL 4 MG/2ML IJ SOLN
4.0000 mg | Freq: Four times a day (QID) | INTRAMUSCULAR | Status: DC | PRN
  Administered 2019-03-07: 09:00:00 4 mg via INTRAVENOUS
  Filled 2019-03-07: qty 2

## 2019-03-07 NOTE — Discharge Summary (Signed)
Jenna Morris, is a 53 y.o. female  DOB 03/19/1966  MRN [id number].  Admission date:  03/06/2019  Admitting Physician  Jani Gravel, MD  Discharge Date:  03/07/2019   Primary MD  Patient, No Pcp Per  Recommendations for primary care physician for things to follow:   1)Avoid ibuprofen/Advil/Aleve/Motrin/Goody Powders/Naproxen/BC powders/Meloxicam/Diclofenac/Indomethacin and other Nonsteroidal anti-inflammatory medications as these will make you more likely to bleed and can cause stomach ulcers, can also cause Kidney problems.  2)STOP Tizanidine (Zanaflex) 3)Reduce baclofen to 5 mg twice a day 4) outpatient alcohol and substance abuse counseling and rehab is strongly recommended 5) your husband we have to monitor how much muscle relaxants you are taking to avoid overdose- 6) absolutely no alcohol intake in combination with muscle relaxants 7) follow-up to primary care physician this week for recheck and reevaluation 8) if you  become depressed or suicidal--- please call-National Suicide Prevention Lifeline at [phone]   Admission Diagnosis  Somnolence [R40.0]   Discharge Diagnosis  Somnolence [R40.0]    Principal Problem:   Altered mental status Active Problems:   GAD (generalized anxiety disorder)   MDD (major depressive disorder)   Chronic pain disorder   Hypokalemia      Past Medical History:  Diagnosis Date  . Back pain   . Depression   . DJD (degenerative joint disease)     History reviewed. No pertinent surgical history.     HPI  from the history and physical done on the day of admission:    - Jenna Morris  is a 53 y.o. female, w anxiety/ depression, ADD, chronic neck pain, apparently presents with c/o altered mental status. Pt reportedly took some muscle relaxer. Started on Friday.  Hard to awaken since Saturday nite. Apparently fell asleep at church and then went to her fathers  house and was somnolent and brought to ED.   In ED,  T 97 , P 49 R 16, Bp 113/74 pox 98% on RA  CT brain  IMPRESSION: Negative non contrasted CT appearance of the brain  CXR IMPRESSION: Mild cardiomegaly with mild central pulmonary vascular congestion  Na 141, K 3.2, Bun 18, Creatinine 0.69 Ast 13, Alt  14 Wbc 11.4, hgb 13.6, Plt 312 Ammonia 11  Urinalysis negative UDS + amphetamine  Pt will be admitted for altered mental status secondary to ? Muscle relaxer use    Hospital Course:      -AMS secondary to muscle relaxer use Check b12, esr, rpr, tsh -Altered mentation is resolved, patient is back to baseline according to her husband who is at bedside --Patient denies intentional overdose.  It appears patient took additional doses of her medications -Husband promises to take control of the medications as ordered patient will get what she needs for the day   GAD/ Depression/chronic pain -Resume medications but take only as prescribed and avoid taking additional months beyond and above what is prescribed -Decrease baclofen to 5 mg twice daily -Stop Zanaflex -Again patient denies suicidal ideation or plan, patient denies intentional overdose -  Her husband concurs -Continue Zoloft -Resources for outpatient psychiatric and psychological, as well as polysubstance abuse counseling and help given to patient by Education officer, museum   ADD Cont Vyvanse  Alcohol use--- patient denies excessive alcohol use patient uses alcohol -Strongly recommend that she not take alcohol with her benzos and muscle relaxants   Discharge Condition: Stable, as per husband patient is back to baseline  Follow UP--behavioral health specialist as advised  Diet and Activity recommendation:  As advised  Discharge Instructions    Discharge Instructions    Call MD for:  difficulty breathing, headache or visual disturbances   Complete by: As directed    Call MD for:  persistant dizziness or  light-headedness   Complete by: As directed    Call MD for:  persistant nausea and vomiting   Complete by: As directed    Call MD for:  severe uncontrolled pain   Complete by: As directed    Call MD for:  temperature >100.4   Complete by: As directed    Diet - low sodium heart healthy   Complete by: As directed    Discharge instructions   Complete by: As directed    1)Avoid ibuprofen/Advil/Aleve/Motrin/Goody Powders/Naproxen/BC powders/Meloxicam/Diclofenac/Indomethacin and other Nonsteroidal anti-inflammatory medications as these will make you more likely to bleed and can cause stomach ulcers, can also cause Kidney problems.  2)STOP Tizanidine (Zanaflex) 3)Reduce baclofen to 5 mg twice a day 4) outpatient alcohol and substance abuse counseling and rehab is strongly recommended 5) your husband we have to monitor how much muscle relaxants you are taking to avoid overdose- 6) absolutely no alcohol intake in combination with muscle relaxants 7) follow-up to primary care physician this week for recheck and reevaluation 8) if you  become depressed or suicidal--- please call-National Suicide Prevention Lifeline at 1- [phone]   Increase activity slowly   Complete by: As directed         Discharge Medications     Allergies as of 03/07/2019      Reactions   Penicillins Hives   Aspirin Rash      Medication List    STOP taking these medications   ibuprofen 200 MG tablet Commonly known as: ADVIL   meloxicam 7.5 MG tablet Commonly known as: MOBIC   naproxen sodium 220 MG tablet Commonly known as: ALEVE   predniSONE 10 MG tablet Commonly known as: DELTASONE   tiZANidine 4 MG tablet Commonly known as: ZANAFLEX     TAKE these medications   acetaminophen 325 MG tablet Commonly known as: TYLENOL Take 2 tablets (650 mg total) by mouth every 6 (six) hours as needed for mild pain (or Fever >/= 101).   baclofen 10 MG tablet Commonly known as: LIORESAL Take 0.5 tablets (5 mg  total) by mouth 2 (two) times daily. What changed:   how much to take  when to take this   clonazePAM 0.5 MG tablet Commonly known as: KLONOPIN Take 0.5 mg by mouth 2 (two) times daily as needed for anxiety.   gabapentin 600 MG tablet Commonly known as: NEURONTIN Take 600 mg by mouth 3 (three) times daily.   lisdexamfetamine 40 MG capsule Commonly known as: VYVANSE Take 40 mg by mouth every morning.   sertraline 50 MG tablet Commonly known as: ZOLOFT Take 50 mg by mouth daily.       Major procedures and Radiology Reports - PLEASE review detailed and final reports for all details, in brief -   Ct Head Wo Contrast  Result  Date: 03/06/2019 CLINICAL DATA:  Altered LOC EXAM: CT HEAD WITHOUT CONTRAST TECHNIQUE: Contiguous axial images were obtained from the base of the skull through the vertex without intravenous contrast. COMPARISON:  None. FINDINGS: Brain: No evidence of acute infarction, hemorrhage, hydrocephalus, extra-axial collection or mass lesion/mass effect. Vascular: No hyperdense vessel or unexpected calcification. Skull: Normal. Negative for fracture or focal lesion. Sinuses/Orbits: No acute finding. Other: None IMPRESSION: Negative non contrasted CT appearance of the brain Electronically Signed   By: Donavan Foil M.D.   On: 03/06/2019 17:35   Dg Chest Portable 1 View  Result Date: 03/06/2019 CLINICAL DATA:  Weakness. EXAM: PORTABLE CHEST 1 VIEW COMPARISON:  None. FINDINGS: Mild cardiomegaly is noted with mild central pulmonary vascular congestion. No pneumothorax or pleural effusion is noted. No consolidative process is noted. Bony thorax is unremarkable. IMPRESSION: Mild cardiomegaly with mild central pulmonary vascular congestion Electronically Signed   By: Marijo Conception M.D.   On: 03/06/2019 16:28    Micro Results   Recent Results (from the past 240 hour(s))  SARS Coronavirus 2 [facility] order, Performed in [facility] hospital lab) Nasopharyngeal Nasopharyngeal  Swab     Status: None   Collection Time: 03/06/19  4:53 PM   Specimen: Nasopharyngeal Swab  Result Value Ref Range Status   SARS Coronavirus 2 NEGATIVE NEGATIVE Final    Comment: (NOTE) If result is NEGATIVE SARS-CoV-2 target nucleic acids are NOT DETECTED. The SARS-CoV-2 RNA is generally detectable in upper and lower  respiratory specimens during the acute phase of infection. The lowest  concentration of SARS-CoV-2 viral copies this assay can detect is 250  copies / mL. A negative result does not preclude SARS-CoV-2 infection  and should not be used as the sole basis for treatment or other  patient management decisions.  A negative result may occur with  improper specimen collection / handling, submission of specimen other  than nasopharyngeal swab, presence of viral mutation(s) within the  areas targeted by this assay, and inadequate number of viral copies  (<250 copies / mL). A negative result must be combined with clinical  observations, patient history, and epidemiological information. If result is POSITIVE SARS-CoV-2 target nucleic acids are DETECTED. The SARS-CoV-2 RNA is generally detectable in upper and lower  respiratory specimens dur ing the acute phase of infection.  Positive  results are indicative of active infection with SARS-CoV-2.  Clinical  correlation with patient history and other diagnostic information is  necessary to determine patient infection status.  Positive results do  not rule out bacterial infection or co-infection with other viruses. If result is PRESUMPTIVE POSTIVE SARS-CoV-2 nucleic acids MAY BE PRESENT.   A presumptive positive result was obtained on the submitted specimen  and confirmed on repeat testing.  While 2019 novel coronavirus  (SARS-CoV-2) nucleic acids may be present in the submitted sample  additional confirmatory testing may be necessary for epidemiological  and / or clinical management purposes  to differentiate between  SARS-CoV-2  and other Sarbecovirus currently known to infect humans.  If clinically indicated additional testing with an alternate test  methodology [phone]) is advised. The SARS-CoV-2 RNA is generally  detectable in upper and lower respiratory sp ecimens during the acute  phase of infection. The expected result is Negative. Fact Sheet for Patients:  StrictlyIdeas.no Fact Sheet for Healthcare Providers: BankingDealers.co.za This test is not yet approved or cleared by the [place] FDA and has been authorized for detection and/or diagnosis of SARS-CoV-2 by FDA under an Emergency Use  Authorization (EUA).  This EUA will remain in effect (meaning this test can be used) for the duration of the COVID-19 declaration under Section 564(b)(1) of the Act, 21 U.S.C. section 360bbb-3(b)(1), unless the authorization is terminated or revoked sooner. Performed at [facility], [address], [place], [place] [address]        Today   [place] today has no new complaints -Husband at bedside -Husband states that patient is back to baseline physically and emotionally -Patient apologetic -Denies intentional overdose -Denies suicidal or homicidal ideation or plan          Patient has been seen and examined prior to discharge   Objective   Blood pressure 118/80, pulse 71, temperature 98.1 F (36.7 C), temperature source Oral, resp. rate 18, height _0  (1.702 m), weight 82.9 kg, SpO2 98 %.   Intake/Output Summary (Last 24 hours) at 03/07/2019 1708 Last data filed at 03/07/2019 1410 Gross per 24 hour  Intake 1279.38 ml  Output 3250 ml  Net -1970.62 ml    Exam Gen:- Awake Alert, no acute distress  HEENT:- [place].AT, No sclera icterus Neck-Supple Neck,No JVD,.  Lungs-  CTAB , good air movement bilaterally  CV- S1, S2 normal, regular Abd-  +ve B.Sounds, Abd Soft, No tenderness,    Extremity/Skin:- No  edema,   good pulses  Psych-affect is appropriate, oriented x3 Neuro-no new focal deficits, no tremors    Data Review   CBC w Diff:  Lab Results  Component Value Date   WBC 9.8 03/07/2019   HGB 14.0 03/07/2019   HCT 43.8 03/07/2019   PLT 329 03/07/2019   LYMPHOPCT 13 03/06/2019   MONOPCT 6 03/06/2019   EOSPCT 1 03/06/2019   BASOPCT 0 03/06/2019    CMP:  Lab Results  Component Value Date   NA 145 03/07/2019   K 3.6 03/07/2019   CL 111 03/07/2019   CO2 27 03/07/2019   BUN 11 03/07/2019   CREATININE 0.54 03/07/2019   PROT 6.0 (L) 03/07/2019   ALBUMIN 3.2 (L) 03/07/2019   BILITOT 0.4 03/07/2019   ALKPHOS 51 03/07/2019   AST 10 (L) 03/07/2019   ALT 14 03/07/2019  .   Total Discharge time is about 33 minutes  Roxan Hockey M.D on 03/07/2019 at 5:08 PM  Go to [email or web address] -  for contact info  [facility] - Office  [phone]

## 2019-03-07 NOTE — Discharge Instructions (Signed)
1)Avoid ibuprofen/Advil/Aleve/Motrin/Goody Powders/Naproxen/BC powders/Meloxicam/Diclofenac/Indomethacin and other Nonsteroidal anti-inflammatory medications as these will make you more likely to bleed and can cause stomach ulcers, can also cause Kidney problems.  2)STOP Tizanidine (Zanaflex) 3)Reduce baclofen to 5 mg twice a day 4) outpatient alcohol and substance abuse counseling and rehab is strongly recommended 5) your husband we have to monitor how much muscle relaxants you are taking to avoid overdose- 6) absolutely no alcohol intake in combination with muscle relaxants 7) follow-up to primary care physician this week for recheck and reevaluation 8) if you  become depressed or suicidal--- please call-National Suicide Prevention Lifeline at [phone]
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
## 2019-03-07 NOTE — Plan of Care (Signed)

## 2019-03-08 LAB — HIV ANTIBODY (ROUTINE TESTING W REFLEX): HIV Screen 4th Generation wRfx: NONREACTIVE

## 2019-03-08 LAB — RPR: RPR Ser Ql: NONREACTIVE — AB

## 2019-03-08 LAB — ANA: Anti Nuclear Antibody (ANA): NEGATIVE

## 2019-03-08 NOTE — Clinical Social Work Note (Signed)
Patient contacted Dept[address] 300 secretary advising that she had left the SA resources provided to her in the room yesterday.  LCSW returned contact to patient, patient requested information on SA resources be emailed to her spouse's email address at [email or web address].  Resources emailed.     Suren Payne, Clydene Pugh, LCSW

## 2019-03-09 ENCOUNTER — Encounter (HOSPITAL_COMMUNITY): Payer: Self-pay | Admitting: Emergency Medicine

## 2020-08-17 IMAGING — CT CT HEAD W/O CM
3 series · 15 of 46 positions shown, 18 images · non-contrast
Comparison: None.

CLINICAL DATA: Altered LOC

EXAM:
CT HEAD WITHOUT CONTRAST
TECHNIQUE: Contiguous axial images were obtained from the base of the skull
through the vertex without intravenous contrast.

[Series 2: head w o · axial · 0.42mm/px · z∈[+204,+324]mm · 9 of 29 slices shown, 12 images]
[im 3/29  brain]
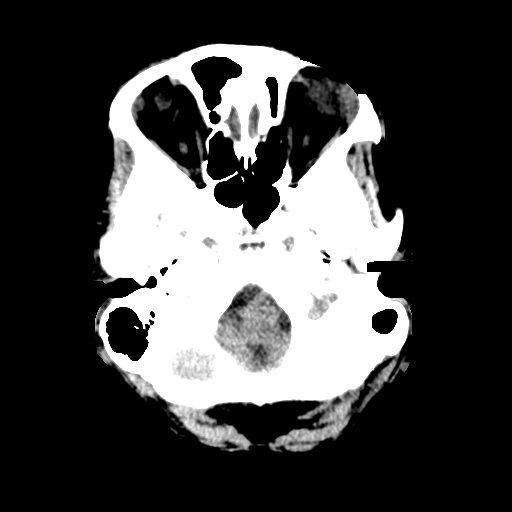
[im 3/29  bone]
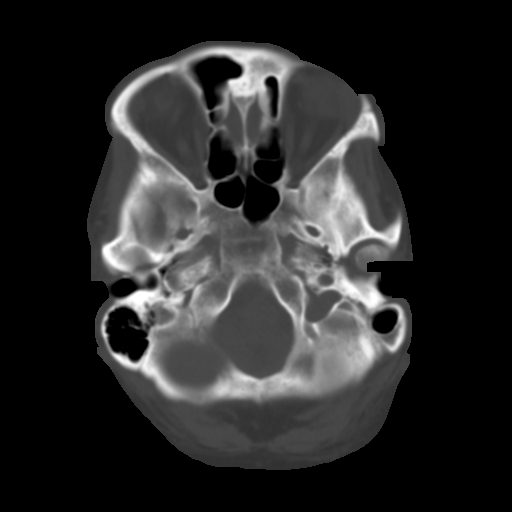
[im 6/29  brain]
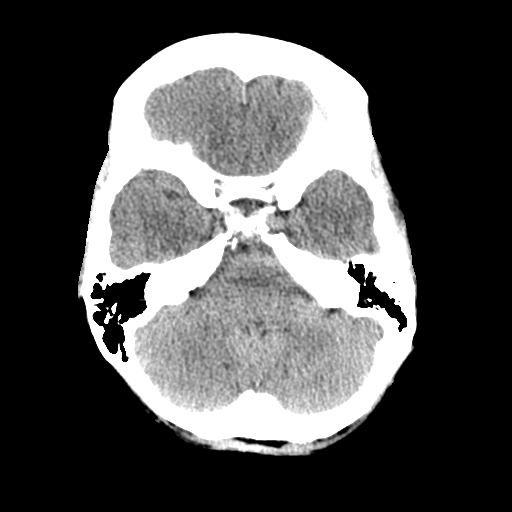
[im 9/29  brain]
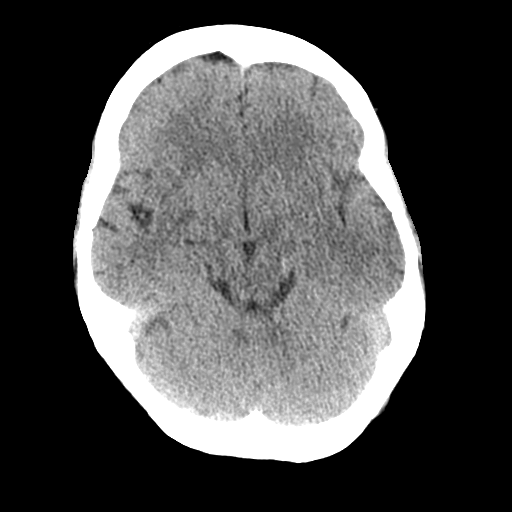
[im 12/29  brain]
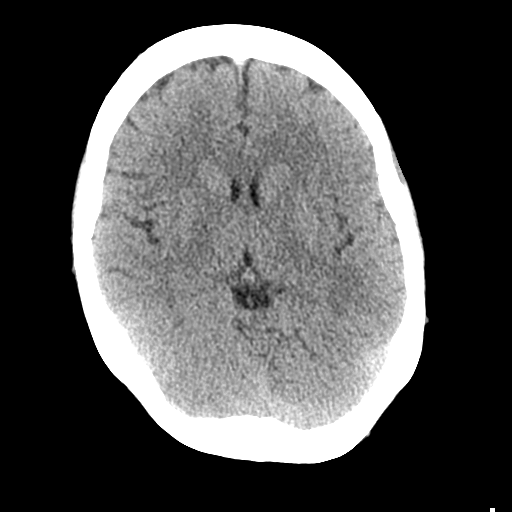
[im 15/29  brain]
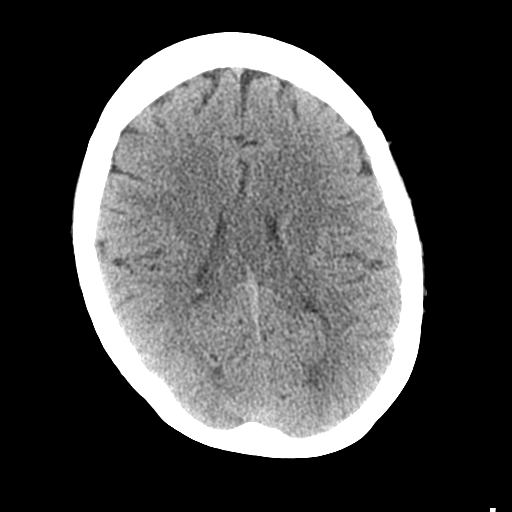
[im 15/29  bone]
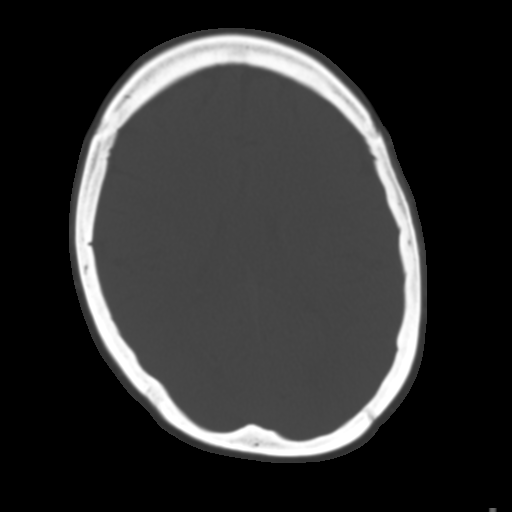
[im 18/29  brain]
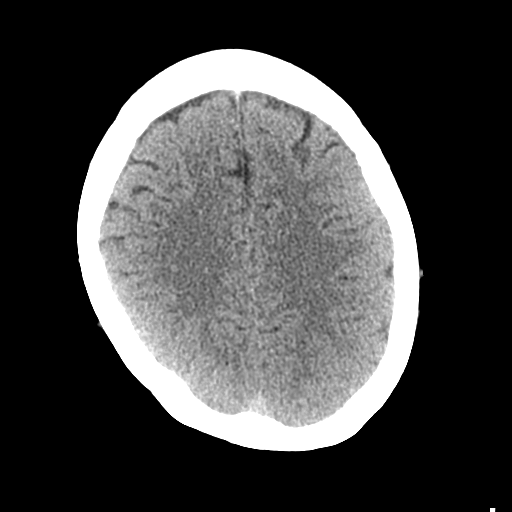
[im 21/29  brain]
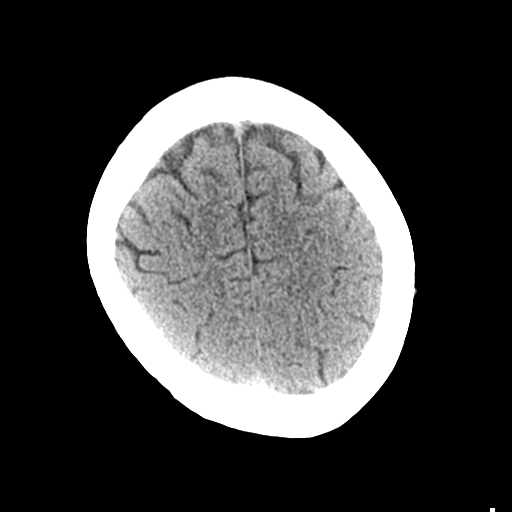
[im 24/29  brain]
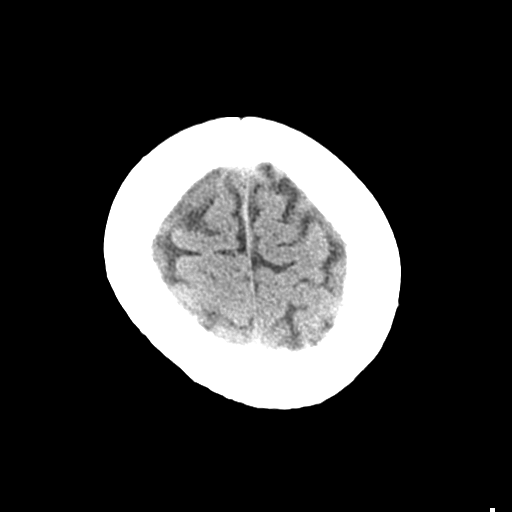
[im 27/29  brain]
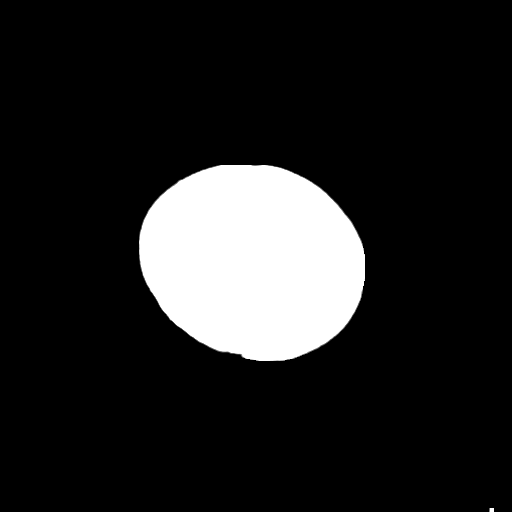
[im 27/29  bone]
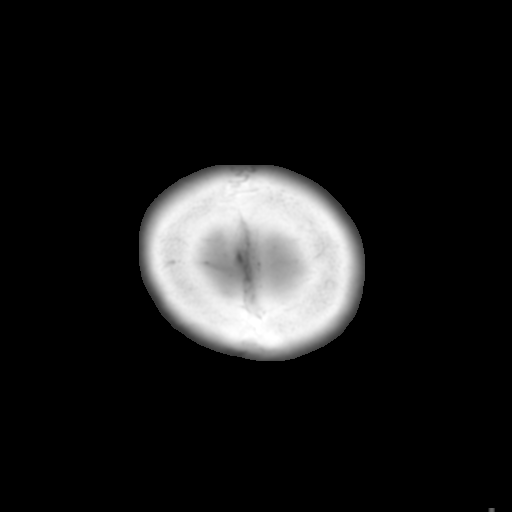

[Series 4: coronal soft · coronal · 0.31mm/px · 3 of 67 slices shown]
[im 23/67  brain]
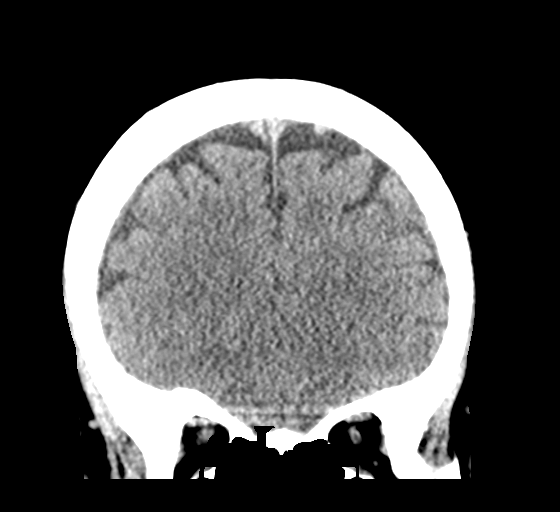
[im 30/67  brain]
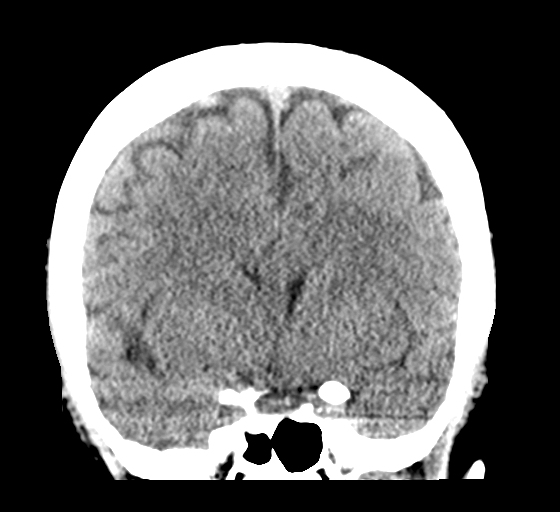
[im 37/67  brain]
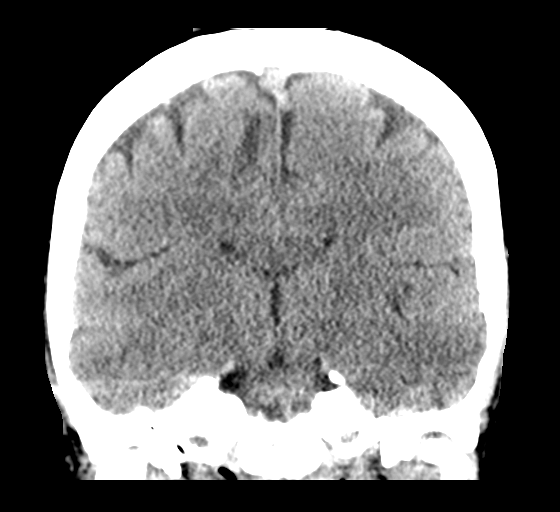

[Series 5: sagittal soft · sagittal · 0.30mm/px · 3 of 58 slices shown]
[im 20/58  brain]
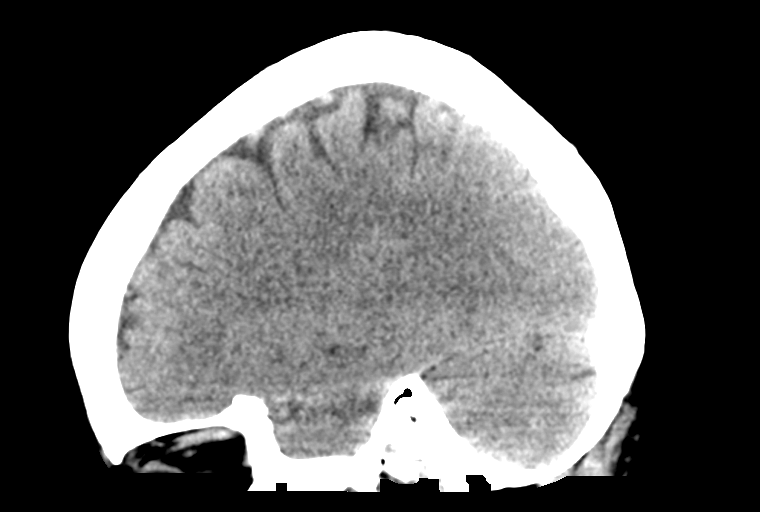
[im 29/58  brain]
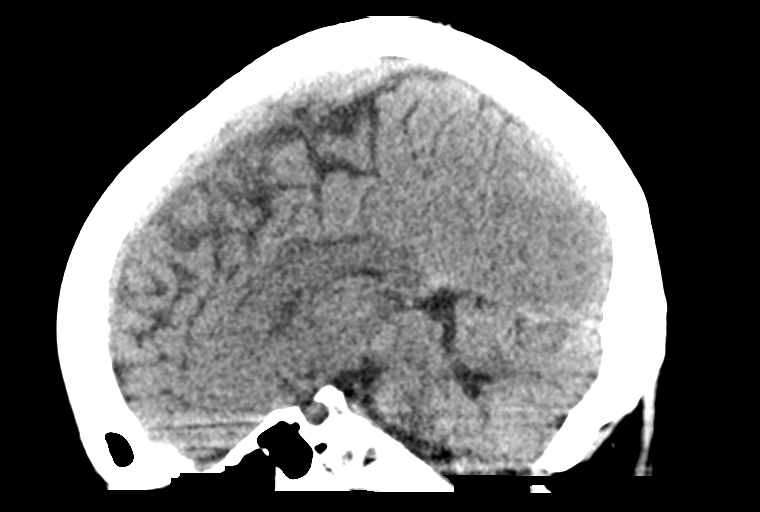
[im 39/58  brain]
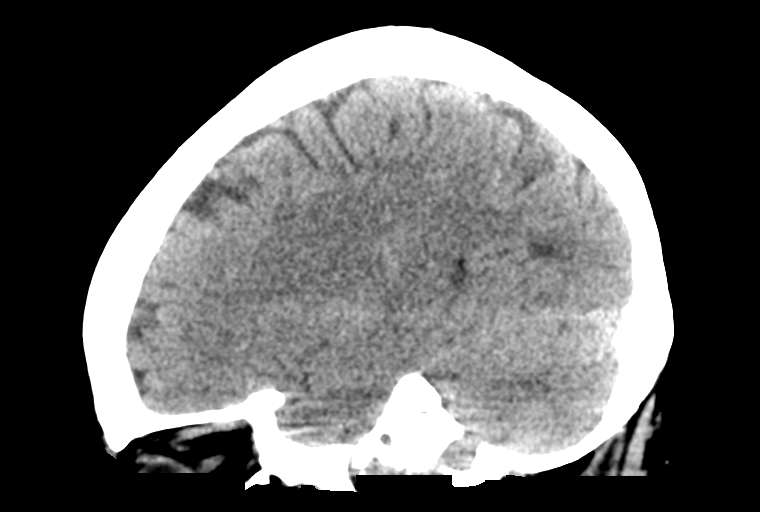

[15 of 46 positions shown; findings below may reference images not displayed]

FINDINGS: Brain: No evidence of acute infarction, hemorrhage, hydrocephalus,
extra-axial collection or mass lesion/mass effect.

Vascular: No hyperdense vessel or unexpected calcification.

Skull: Normal. Negative for fracture or focal lesion.

Sinuses/Orbits: No acute finding.

Other: None
IMPRESSION: Negative non contrasted CT appearance of the brain

## 2020-08-17 IMAGING — CR DG CHEST 1V PORT
1 series · 1 of 1 positions shown · non-contrast
Comparison: None.

CLINICAL DATA: Weakness.

EXAM:
PORTABLE CHEST 1 VIEW

[portable]
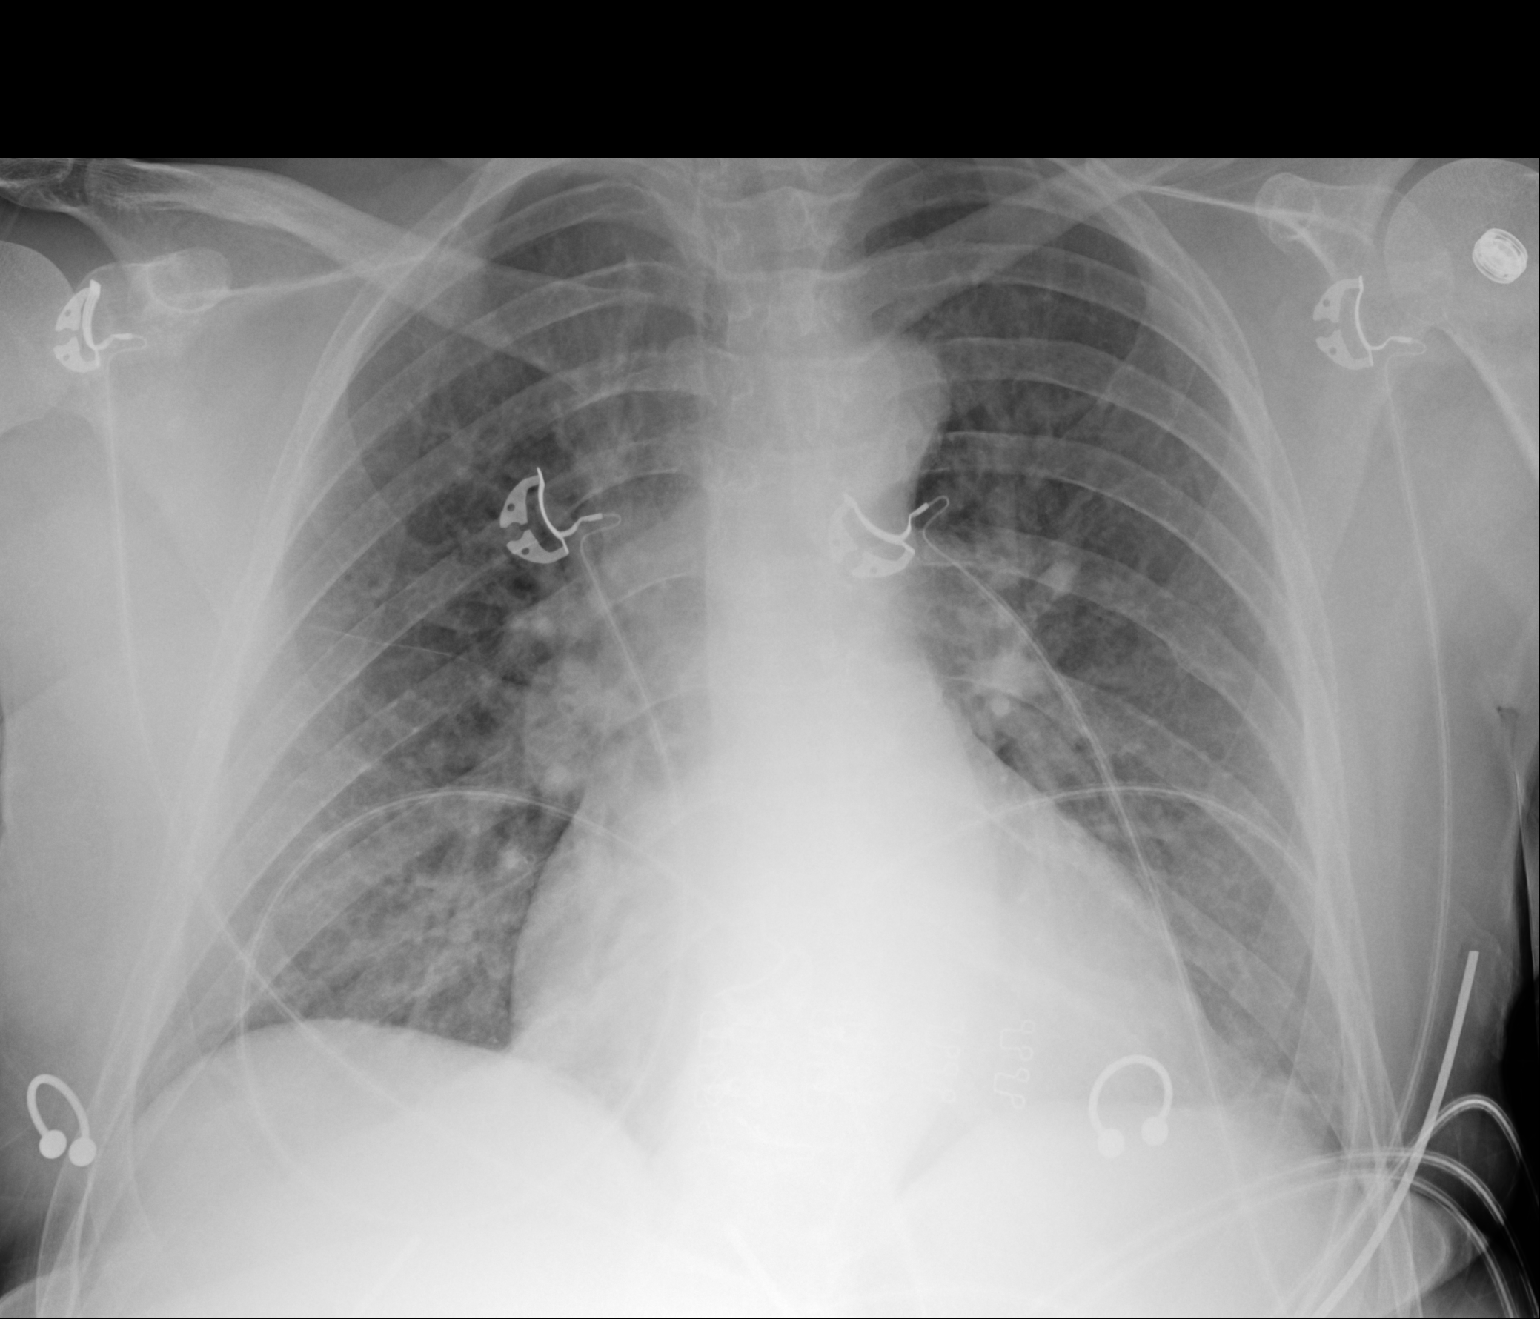

[1 of 1 positions shown; findings below may reference images not displayed]

FINDINGS: Mild cardiomegaly is noted with mild central pulmonary vascular
congestion. No pneumothorax or pleural effusion is noted. No
consolidative process is noted. Bony thorax is unremarkable.
IMPRESSION: Mild cardiomegaly with mild central pulmonary vascular congestion

## 2020-10-05 ENCOUNTER — Other Ambulatory Visit: Payer: Self-pay

## 2020-10-05 ENCOUNTER — Encounter: Payer: Self-pay | Admitting: Emergency Medicine

## 2020-10-05 ENCOUNTER — Emergency Department (INDEPENDENT_AMBULATORY_CARE_PROVIDER_SITE_OTHER)
Admission: EM | Admit: 2020-10-05 | Discharge: 2020-10-05 | Disposition: A | Discharge status: Home / Self Care | Payer: 59 | Source: Home / Self Care | Attending: Family Medicine | Admitting: Family Medicine

## 2020-10-05 DIAGNOSIS — R404 Transient alteration of awareness: Secondary | ICD-10-CM | POA: Diagnosis not present

## 2020-10-05 DIAGNOSIS — R259 Unspecified abnormal involuntary movements: Secondary | ICD-10-CM

## 2020-10-05 HISTORY — DX: Anxiety disorder, unspecified: F41.9

## 2020-10-05 NOTE — Discharge Instructions (Addendum)
Recommend proceeding to local emergency room for further evaluation
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
## 2020-10-05 NOTE — ED Notes (Signed)
Patient is being discharged from the Urgent Care and sent to the Emergency Department via POV w/ her husband . Per Dr Cathren Harsh, patient is in need of higher level of care due to mentation changes earlier in the day. Patient is aware and verbalizes understanding of plan of care. Pt AAOx3 w/ = & reactive pupils on arrival and at discharge. Vitals:   10/05/20 2018  BP: 108/76  Pulse: (!) 55  Resp: 15  Temp: 98.6 F (37 C)  SpO2: 96%

## 2020-10-05 NOTE — ED Triage Notes (Signed)
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
